# Patient Record
Sex: Female | Born: 2000 | Hispanic: Yes | Marital: Married | Smoking: Never smoker
Health system: Southern US, Community
[De-identification: ages and names within clinical notes are randomized; demographics above are authoritative.]

---
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
## 2020-09-15 ENCOUNTER — Encounter (HOSPITAL_COMMUNITY): Payer: Self-pay | Admitting: Emergency Medicine

## 2020-09-15 ENCOUNTER — Other Ambulatory Visit: Payer: Self-pay

## 2020-09-15 ENCOUNTER — Emergency Department (HOSPITAL_COMMUNITY)
Admission: EM | Admit: 2020-09-15 | Discharge: 2020-09-16 | Disposition: A | Discharge status: Home / Self Care | Payer: No Typology Code available for payment source | Attending: Emergency Medicine | Admitting: Emergency Medicine

## 2020-09-15 DIAGNOSIS — R1110 Vomiting, unspecified: Secondary | ICD-10-CM | POA: Insufficient documentation

## 2020-09-15 DIAGNOSIS — R109 Unspecified abdominal pain: Secondary | ICD-10-CM | POA: Insufficient documentation

## 2020-09-15 DIAGNOSIS — A059 Bacterial foodborne intoxication, unspecified: Secondary | ICD-10-CM | POA: Insufficient documentation

## 2020-09-15 LAB — URINALYSIS, ROUTINE W REFLEX MICROSCOPIC
Bilirubin Urine: NEGATIVE
Glucose, UA: NEGATIVE mg/dL
Ketones, ur: 20 mg/dL — AB
Leukocytes,Ua: NEGATIVE
Nitrite: NEGATIVE
Protein, ur: 30 mg/dL — AB
Specific Gravity, Urine: 1.028 (ref 1.005–1.030)
pH: 5 (ref 5.0–8.0)

## 2020-09-15 LAB — CBC
HCT: 47.2 % — ABNORMAL HIGH (ref 36.0–46.0)
Hemoglobin: 15.9 g/dL — ABNORMAL HIGH (ref 12.0–15.0)
MCH: 30.9 pg (ref 26.0–34.0)
MCHC: 33.7 g/dL (ref 30.0–36.0)
MCV: 91.7 fL (ref 80.0–100.0)
Platelets: 360 10*3/uL (ref 150–400)
RBC: 5.15 MIL/uL — ABNORMAL HIGH (ref 3.87–5.11)
RDW: 12.9 % (ref 11.5–15.5)
WBC: 21.2 10*3/uL — ABNORMAL HIGH (ref 4.0–10.5)
nRBC: 0 % (ref 0.0–0.2)

## 2020-09-15 MED ORDER — SODIUM CHLORIDE 0.9 % IV BOLUS
1000.0000 mL | Freq: Once | INTRAVENOUS | Status: AC
  Administered 2020-09-16: 1000 mL via INTRAVENOUS

## 2020-09-15 MED ORDER — ONDANSETRON 4 MG PO TBDP: 4.0000 mg | ORAL_TABLET | Freq: Once | ORAL | Status: DC

## 2020-09-15 NOTE — ED Triage Notes (Signed)
Pt states ongoing nausea and vomiting since 5 pm this afternoon along with abd pain, for about 10 times. X 1 emesis of "a little blood." Unable to keep fluids down. Denies pain at this time.

## 2020-09-15 NOTE — ED Provider Notes (Signed)
Emergency Medicine Provider Triage Evaluation Note  Ann Murray , a 20 y.o. female  was evaluated in triage.  Pt complains of vomiting that started today.  Reports multiple episodes.  Denies fever or diarrhea.  Denies abdominal pain.  Review of Systems  Positive: Vomiting, nausea Negative: Diarrhea, fever  Physical Exam  BP 120/75 (BP Location: Left Arm)   Pulse 91   Temp 98 F (36.7 C)   Resp 20   Ht 5\' 4"  (1.626 m)   Wt 86.2 kg   LMP 09/15/2020   SpO2 100%   BMI 32.61 kg/m  Gen:   Awake, no distress   Resp:  Normal effort  MSK:   Moves extremities without difficulty  Other:    Medical Decision Making  Medically screening exam initiated at 10:10 PM.  Appropriate orders placed.  11/15/2020 Ann Murray was informed that the remainder of the evaluation will be completed by another provider, this initial triage assessment does not replace that evaluation, and the importance of remaining in the ED until their evaluation is complete.     Ann Salts, PA-C 09/15/20 2211    [place], 2212, MD 09/16/20 [phone]

## 2020-09-16 LAB — COMPREHENSIVE METABOLIC PANEL
ALT: 30 U/L (ref 0–44)
AST: 24 U/L (ref 15–41)
Albumin: 4.3 g/dL (ref 3.5–5.0)
Alkaline Phosphatase: 60 U/L (ref 38–126)
Anion gap: 12 (ref 5–15)
BUN: 16 mg/dL (ref 6–20)
CO2: 21 mmol/L — ABNORMAL LOW (ref 22–32)
Calcium: 9.5 mg/dL (ref 8.9–10.3)
Chloride: 110 mmol/L (ref 98–111)
Creatinine, Ser: 0.76 mg/dL (ref 0.44–1.00)
GFR, Estimated: >60 mL/min (ref >60)
Glucose, Bld: 98 mg/dL (ref 70–99)
Potassium: 3.7 mmol/L (ref 3.5–5.1)
Sodium: 143 mmol/L (ref 135–145)
Total Bilirubin: 0.7 mg/dL (ref 0.3–1.2)
Total Protein: 7.5 g/dL (ref 6.5–8.1)

## 2020-09-16 LAB — PREGNANCY, URINE: Preg Test, Ur: NEGATIVE

## 2020-09-16 LAB — LIPASE, BLOOD: Lipase: 30 U/L (ref 11–51)

## 2020-09-16 MED ORDER — LIDOCAINE VISCOUS HCL 2 % MT SOLN
15.0000 mL | Freq: Once | OROMUCOSAL | Status: AC
  Administered 2020-09-16: 15 mL via ORAL
  Filled 2020-09-16: qty 15

## 2020-09-16 MED ORDER — ONDANSETRON HCL 4 MG/2ML IJ SOLN
4.0000 mg | Freq: Once | INTRAMUSCULAR | Status: AC
  Administered 2020-09-16: 4 mg via INTRAVENOUS
  Filled 2020-09-16: qty 2

## 2020-09-16 MED ORDER — SODIUM CHLORIDE 0.9 % IV BOLUS (SEPSIS)
1000.0000 mL | Freq: Once | INTRAVENOUS | Status: AC
  Administered 2020-09-16: 1000 mL via INTRAVENOUS

## 2020-09-16 MED ORDER — ONDANSETRON 4 MG PO TBDP: 4.0000 mg | ORAL_TABLET | Freq: Three times a day (TID) | ORAL | 0 refills | Status: AC | PRN

## 2020-09-16 MED ORDER — ALUM & MAG HYDROXIDE-SIMETH 200-200-20 MG/5ML PO SUSP
30.0000 mL | Freq: Once | ORAL | Status: AC
  Administered 2020-09-16: 30 mL via ORAL
  Filled 2020-09-16: qty 30

## 2020-09-16 MED ORDER — SODIUM CHLORIDE 0.9 % IV SOLN
1000.0000 mL | INTRAVENOUS | Status: DC
  Administered 2020-09-16: 1000 mL via INTRAVENOUS

## 2020-09-16 NOTE — ED Provider Notes (Signed)
[facility] EMERGENCY DEPARTMENT Provider Note  CSN: [id number] Arrival date & time: 09/15/20 2117  Chief Complaint(s) Emesis and Abdominal Pain  HPI Ann Murray is a 20 y.o. female    Emesis Severity:  Severe Duration:  3 hours Timing:  Intermittent Number of daily episodes:  10+ times Quality:  Bilious material, coffee grounds, stomach contents and bright red blood Able to tolerate:  Liquids Progression:  Resolved Chronicity:  New Context: not post-tussive and not self-induced   Exacerbated by: oral intake. Associated symptoms: abdominal pain (only with emesis. no pain otherwise)   Associated symptoms: no cough, no diarrhea, no headaches, no myalgias, no sore throat and no URI   Risk factors: suspect food intake (had shrimp in mid morning)   Risk factors: no alcohol use and no sick contacts   Abdominal Pain Associated symptoms: vomiting   Associated symptoms: no cough, no diarrhea and no sore throat    Past Medical History History reviewed. No pertinent past medical history. There are no problems to display for this patient.  Home Medication(s) Prior to Admission medications   Medication Sig Start Date End Date Taking? Authorizing Provider  DEPO-PROVERA 150 MG/ML injection Inject 150 mg into the muscle every 3 (three) months. 05/09/20  Yes [provider]  ondansetron (ZOFRAN ODT) 4 MG disintegrating tablet Take 1 tablet (4 mg total) by mouth every 8 (eight) hours as needed for up to 3 days for nausea or vomiting. 09/16/20 09/19/20 Yes Adonai Selsor, Amadeo Garnet, MD                                                                                                                                    Past Surgical History History reviewed. No pertinent surgical history. Family History History reviewed. No pertinent family history.  Social History Social History   Tobacco Use   Smoking status: Never   Smokeless tobacco: Never  Substance  Use Topics   Alcohol use: Never   Drug use: Never   Allergies Patient has no known allergies.  Review of Systems Review of Systems  HENT:  Negative for sore throat.   Respiratory:  Negative for cough.   Gastrointestinal:  Positive for abdominal pain (only with emesis. no pain otherwise) and vomiting. Negative for diarrhea.  Musculoskeletal:  Negative for myalgias.  Neurological:  Negative for headaches.  All other systems are reviewed and are negative for acute change except as noted in the HPI  Physical Exam Vital Signs  I have reviewed the triage vital signs BP 120/75 (BP Location: Left Arm)   Pulse 91   Temp 98 F (36.7 C)   Resp 20   Ht 5\' 4"  (1.626 m)   Wt 86.2 kg   LMP 09/15/2020   SpO2 100%   BMI 32.61 kg/m   Physical Exam Vitals reviewed.  Constitutional:      General: She is not in acute distress.  Appearance: She is well-developed. She is not diaphoretic.  HENT:     Head: Normocephalic and atraumatic.     Right Ear: External ear normal.     Left Ear: External ear normal.     Nose: Nose normal.  Eyes:     General: No scleral icterus.    Conjunctiva/sclera: Conjunctivae normal.  Neck:     Trachea: Phonation normal.  Cardiovascular:     Rate and Rhythm: Normal rate and regular rhythm.  Pulmonary:     Effort: Pulmonary effort is normal. No respiratory distress.     Breath sounds: No stridor.  Abdominal:     General: There is no distension.  Musculoskeletal:        General: Normal range of motion.     Cervical back: Normal range of motion.  Neurological:     Mental Status: She is alert and oriented to person, place, and time.  Psychiatric:        Behavior: Behavior normal.    ED Results and Treatments Labs (all labs ordered are listed, but only abnormal results are displayed) Labs Reviewed  COMPREHENSIVE METABOLIC PANEL - Abnormal; Notable for the following components:      Result Value   CO2 21 (*)    All other components within normal  limits  CBC - Abnormal; Notable for the following components:   WBC 21.2 (*)    RBC 5.15 (*)    Hemoglobin 15.9 (*)    HCT 47.2 (*)    All other components within normal limits  URINALYSIS, ROUTINE W REFLEX MICROSCOPIC - Abnormal; Notable for the following components:   APPearance HAZY (*)    Hgb urine dipstick LARGE (*)    Ketones, ur 20 (*)    Protein, ur 30 (*)    Bacteria, UA MANY (*)    All other components within normal limits  LIPASE, BLOOD  PREGNANCY, URINE  PREGNANCY, URINE                                                                                                                         EKG  EKG Interpretation  Date/Time:    Ventricular Rate:    PR Interval:    QRS Duration:   QT Interval:    QTC Calculation:   R Axis:     Text Interpretation:          Radiology No results found.  Pertinent labs & imaging results that were available during my care of the patient were reviewed by me and considered in my medical decision making (see chart for details).  Medications Ordered in ED Medications  sodium chloride 0.9 % bolus 1,000 mL (0 mLs Intravenous Stopped 09/16/20 0255)    Followed by  0.9 %  sodium chloride infusion (0 mLs Intravenous Stopped 09/16/20 0344)  sodium chloride 0.9 % bolus 1,000 mL (0 mLs Intravenous Stopped 09/16/20 0138)  ondansetron (ZOFRAN) injection 4 mg (4 mg Intravenous Given 09/16/20 0036)  alum & mag  hydroxide-simeth (MAALOX/MYLANTA) 200-200-20 MG/5ML suspension 30 mL (30 mLs Oral Given 09/16/20 0034)    And  lidocaine (XYLOCAINE) 2 % viscous mouth solution 15 mL (15 mLs Oral Given 09/16/20 0034)                                                                                                                                    Procedures Procedures  (including critical care time)  Medical Decision Making / ED Course I have reviewed the nursing notes for this encounter and the patient's prior records (if available in EHR or on provided  paperwork).   Beckie SaltsKarla Canberlario [name] was evaluated in Emergency Department on 09/16/2020 for the symptoms described in the history of present illness. She was evaluated in the context of the global COVID-19 pandemic, which necessitated consideration that the patient might be at risk for infection with the SARS-CoV-2 virus that causes COVID-19. Institutional protocols and algorithms that pertain to the evaluation of patients at risk for COVID-19 are in a state of rapid change based on information released by regulatory bodies including the CDC and federal and state organizations. These policies and algorithms were followed during the patient's care in the ED.  20 y.o. female presents with vomiting, for several hours. Possible suspicious food intake.  Decreased oral tolerance. Rest of history as above.  Patient appears well, not in distress, and with no signs of toxicity or dehydration. Abdomen benign.  Rest of the exam as above  Most consistent with food poisoning.   Doubt appendicitis, diverticulitis, severe colitis, dysentery.    CBC notable for hemoconcentratioin. Rest of labs reassuring w/o significant electrolyte derangements or renal sufficiency.  No evidence of biliary obstruction or pancreatitis.  Able to tolerate oral intake in the ED.  Discussed symptomatic treatment with the patient and they will follow closely with their PCP.       Final Clinical Impression(s) / ED Diagnoses Final diagnoses:  Food poisoning   The patient appears reasonably screened and/or stabilized for discharge and I doubt any other medical condition or other [facility] requiring further screening, evaluation, or treatment in the ED at this time prior to discharge. Safe for discharge with strict return precautions.  Disposition: Discharge  Condition: Good  I have discussed the results, Dx and Tx plan with the patient/family who expressed understanding and agree(s) with the plan. Discharge instructions  discussed at length. The patient/family was given strict return precautions who verbalized understanding of the instructions. No further questions at time of discharge.    ED Discharge Orders          Ordered    ondansetron (ZOFRAN ODT) 4 MG disintegrating tablet  Every 8 hours PRN        09/16/20 0344             Follow Up: Primary care provider  Call  as needed      This chart was dictated using voice  recognition software.  Despite best efforts to proofread,  errors can occur which can change the documentation meaning.    Nira Conn, MD 09/16/20 [phone]
